# Patient Record
Sex: Male | Born: 1985 | Race: Black or African American | Hispanic: No | Marital: Single | Smoking: Current every day smoker
Health system: Southern US, Community
[De-identification: ages and names within clinical notes are randomized; demographics above are authoritative.]

## PROBLEM LIST (undated history)

## (undated) HISTORY — PX: HAND SURGERY: SHX662

---
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
## 2008-09-12 ENCOUNTER — Emergency Department (HOSPITAL_COMMUNITY): Admission: EM | Admit: 2008-09-12 | Discharge: 2008-09-12 | Payer: Self-pay | Admitting: Family Medicine

## 2009-08-10 ENCOUNTER — Emergency Department (HOSPITAL_COMMUNITY): Admission: EM | Admit: 2009-08-10 | Discharge: 2009-08-11 | Payer: Self-pay | Admitting: Emergency Medicine

## 2010-09-21 ENCOUNTER — Emergency Department (HOSPITAL_COMMUNITY): Admission: EM | Admit: 2010-09-21 | Discharge: 2010-09-21 | Payer: Self-pay | Admitting: Emergency Medicine

## 2010-09-28 ENCOUNTER — Ambulatory Visit (HOSPITAL_COMMUNITY): Admission: RE | Admit: 2010-09-28 | Discharge: 2010-09-28 | Payer: Self-pay | Admitting: Orthopedic Surgery

## 2011-01-12 LAB — COMPREHENSIVE METABOLIC PANEL
ALT: 20 U/L (ref 0–53)
AST: 28 U/L (ref 0–37)
Albumin: 4.3 g/dL (ref 3.5–5.2)
Alkaline Phosphatase: 58 U/L (ref 39–117)
BUN: 9 mg/dL (ref 6–23)
CO2: 23 mEq/L (ref 19–32)
Calcium: 9.8 mg/dL (ref 8.4–10.5)
Chloride: 104 mEq/L (ref 96–112)
Creatinine, Ser: 1.04 mg/dL (ref 0.4–1.5)
GFR calc Af Amer: >60 mL/min (ref >60)
GFR calc non Af Amer: >60 mL/min (ref >60)
Glucose, Bld: 86 mg/dL (ref 70–99)
Potassium: 4.1 mEq/L (ref 3.5–5.1)
Sodium: 137 mEq/L (ref 135–145)
Total Bilirubin: 0.9 mg/dL (ref 0.3–1.2)
Total Protein: 7 g/dL (ref 6.0–8.3)

## 2011-01-12 LAB — CBC
HCT: 44.1 % (ref 39.0–52.0)
Hemoglobin: 15.2 g/dL (ref 13.0–17.0)
MCH: 30.4 pg (ref 26.0–34.0)
MCHC: 34.5 g/dL (ref 30.0–36.0)
MCV: 88.2 fL (ref 78.0–100.0)
Platelets: 219 10*3/uL (ref 150–400)
RBC: 5 MIL/uL (ref 4.22–5.81)
RDW: 13.1 % (ref 11.5–15.5)
WBC: 4.2 10*3/uL (ref 4.0–10.5)

## 2011-01-12 LAB — SURGICAL PCR SCREEN
MRSA, PCR: NEGATIVE
Staphylococcus aureus: POSITIVE — AB

## 2011-02-04 LAB — POCT I-STAT, CHEM 8
Calcium, Ion: 1.19 mmol/L (ref 1.12–1.32)
Chloride: 104 mEq/L (ref 96–112)
HCT: 44 % (ref 39.0–52.0)
Potassium: 3.4 mEq/L — ABNORMAL LOW (ref 3.5–5.1)
Sodium: 139 mEq/L (ref 135–145)

## 2011-08-03 LAB — POCT RAPID STREP A: Streptococcus, Group A Screen (Direct): NEGATIVE

## 2013-04-30 ENCOUNTER — Encounter (HOSPITAL_COMMUNITY): Payer: Self-pay | Admitting: Nurse Practitioner

## 2013-04-30 ENCOUNTER — Emergency Department (HOSPITAL_COMMUNITY)
Admission: EM | Admit: 2013-04-30 | Discharge: 2013-05-01 | Disposition: A | Discharge status: Home / Self Care | Payer: Self-pay | Attending: Emergency Medicine | Admitting: Emergency Medicine

## 2013-04-30 ENCOUNTER — Emergency Department (HOSPITAL_COMMUNITY): Payer: Self-pay

## 2013-04-30 DIAGNOSIS — F17200 Nicotine dependence, unspecified, uncomplicated: Secondary | ICD-10-CM | POA: Insufficient documentation

## 2013-04-30 DIAGNOSIS — J029 Acute pharyngitis, unspecified: Secondary | ICD-10-CM | POA: Insufficient documentation

## 2013-04-30 DIAGNOSIS — Y929 Unspecified place or not applicable: Secondary | ICD-10-CM | POA: Insufficient documentation

## 2013-04-30 DIAGNOSIS — M25549 Pain in joints of unspecified hand: Secondary | ICD-10-CM | POA: Insufficient documentation

## 2013-04-30 DIAGNOSIS — W230XXA Caught, crushed, jammed, or pinched between moving objects, initial encounter: Secondary | ICD-10-CM | POA: Insufficient documentation

## 2013-04-30 DIAGNOSIS — R599 Enlarged lymph nodes, unspecified: Secondary | ICD-10-CM | POA: Insufficient documentation

## 2013-04-30 DIAGNOSIS — B9689 Other specified bacterial agents as the cause of diseases classified elsewhere: Secondary | ICD-10-CM

## 2013-04-30 DIAGNOSIS — R1310 Dysphagia, unspecified: Secondary | ICD-10-CM | POA: Insufficient documentation

## 2013-04-30 DIAGNOSIS — Y998 Other external cause status: Secondary | ICD-10-CM | POA: Insufficient documentation

## 2013-04-30 LAB — BASIC METABOLIC PANEL
BUN: 9 mg/dL (ref 6–23)
Chloride: 100 mEq/L (ref 96–112)
GFR calc Af Amer: >90 mL/min (ref >90)
Potassium: 3.9 mEq/L (ref 3.5–5.1)

## 2013-04-30 LAB — CBC WITH DIFFERENTIAL/PLATELET
Basophils Absolute: 0 10*3/uL (ref 0.0–0.1)
Basophils Relative: 0 % (ref 0–1)
Hemoglobin: 15.3 g/dL (ref 13.0–17.0)
MCHC: 34.9 g/dL (ref 30.0–36.0)
Monocytes Relative: 9 % (ref 3–12)
Neutro Abs: 9.5 10*3/uL — ABNORMAL HIGH (ref 1.7–7.7)
Neutrophils Relative %: 75 % (ref 43–77)
WBC: 12.8 10*3/uL — ABNORMAL HIGH (ref 4.0–10.5)

## 2013-04-30 MED ORDER — ONDANSETRON 4 MG PO TBDP
8.0000 mg | ORAL_TABLET | Freq: Once | ORAL | Status: AC
  Administered 2013-05-01: 8 mg via ORAL
  Filled 2013-04-30: qty 2

## 2013-04-30 MED ORDER — DEXAMETHASONE SODIUM PHOSPHATE 10 MG/ML IJ SOLN
10.0000 mg | Freq: Once | INTRAMUSCULAR | Status: AC
  Administered 2013-05-01: 10 mg via INTRAVENOUS
  Filled 2013-04-30: qty 1

## 2013-04-30 MED ORDER — MORPHINE SULFATE 4 MG/ML IJ SOLN: 4.0000 mg | Freq: Once | INTRAMUSCULAR | Status: DC

## 2013-04-30 NOTE — ED Provider Notes (Signed)
This chart was scribed for  Junious Silk PA-C,  a non-physician practitioner working with Juliet Rude. Rubin Payor, MD by Lewanda Rife, ED Scribe. This patient was seen in room TR07C/TR07C and the patient's care was started at 2304.    History    CSN: [id number] Arrival date & time 04/30/13  1831  First MD Initiated Contact with Patient 04/30/13 2004     Chief Complaint  Patient presents with  . Hand Pain  . Sore Throat   (Consider location/radiation/quality/duration/timing/severity/associated sxs/prior Treatment) The history is provided by the patient.   HPI Comments: Brandon Larson is a 27 y.o. male who presents to the Emergency Department complaining of "possible abscess" in posterior throat onset yesterday. Reports malodorous yellow drainage after "something popped in his throat". Describes sore throat pain as aching and throbbing. Additionally, reports right hand pain after slamming it into a metal door 3 days ago. Pain is aching, worse with palpation, and does not radiate. Denies associated fever, nausea, vomiting, shortness of breath, and difficulty swallowing. Denies taking any pain medications to relieve symptoms PTA.   History reviewed. No pertinent past medical history. History reviewed. No pertinent past surgical history. History reviewed. No pertinent family history. History  Substance Use Topics  . Smoking status: Current Every Day Smoker    Types: Cigarettes  . Smokeless tobacco: Not on file  . Alcohol Use: Yes    Review of Systems  Constitutional: Negative for fever.  HENT: Positive for sore throat.   Musculoskeletal: Positive for myalgias (right hand pain ).  All other systems reviewed and are negative.  A complete 10 system review of systems was obtained and all systems are negative except as noted in the HPI and PMH.    Allergies  Review of patient's allergies indicates no known allergies.  Home Medications  No current outpatient prescriptions on  file. BP 131/66  Pulse 94  Temp(Src) 98.9 F (37.2 C) (Oral)  Wt 200 lb (90.719 kg)  SpO2 96% Physical Exam  Nursing note and vitals reviewed. Constitutional: He is oriented to person, place, and time. He appears well-developed and well-nourished. No distress.  HENT:  Head: Normocephalic and atraumatic. No trismus in the jaw.  Mouth/Throat: Mucous membranes are normal. No edematous. Oropharyngeal exudate and posterior oropharyngeal erythema present. No posterior oropharyngeal edema.  Left tonsillar swelling with exudate  Eyes: EOM are normal.  Neck: Neck supple. No tracheal deviation present.  Cardiovascular: Normal rate.   Pulmonary/Chest: Effort normal. No respiratory distress.  Musculoskeletal: Normal range of motion.       Right hand: He exhibits tenderness (dorsal aspect ).  Neurological: He is alert and oriented to person, place, and time.  Skin: Skin is warm and dry.  Psychiatric: He has a normal mood and affect. His behavior is normal.    ED Course  Procedures (including critical care time) Labs Reviewed  CBC WITH DIFFERENTIAL - Abnormal; Notable for the following:    WBC 12.8 (*)    Neutro Abs 9.5 (*)    Monocytes Absolute 1.2 (*)    All other components within normal limits  BASIC METABOLIC PANEL - Abnormal; Notable for the following:    Glucose, Bld 101 (*)    All other components within normal limits   Ct Soft Tissue Neck W Contrast  05/01/2013   *RADIOLOGY REPORT*  Clinical Data: Throat pain and swelling.  Drainage.  CT NECK WITH CONTRAST  Technique:  Multidetector CT imaging of the neck was performed with intravenous contrast.  Contrast:  80mL OMNIPAQUE IOHEXOL 300 MG/ML  SOLN  Comparison: None.  Findings: Bilateral tonsillar hypertrophy is present.  There is no abscess.  The retropharyngeal soft tissues are within normal limits.  No retropharyngeal effusion.  Prevertebral soft tissues are normal.  Cervical spinal alignment is anatomic.  The diffuse enlargement of  the tonsillar tissue and wall wires ring.  Adenoidal hypertrophy is present as well.  Transverse narrowing of the airway is present associated with tonsillar hypertrophy.  Mildly enlarged bilateral level II lymph nodes are present.  Visualized lung apices appear within normal limits.  Vasculature of the neck is within normal limits.  IMPRESSION: The tonsillar hypertrophy.  No abscess or retropharyngeal effusion. Mildly enlarged bilateral likely reactive level II lymph nodes.   Original Report Authenticated By: Andreas Newport, M.D.   Dg Hand Complete Right  04/30/2013   *RADIOLOGY REPORT*  Clinical Data: Right hand injury, pain.  History of prior surgery.  RIGHT HAND - COMPLETE 3+ VIEW  Comparison: Plain films 09/21/2010.  Findings: K-wires fix healed fractures of the third and fourth metacarpals.  No acute bony or joint abnormality is identified.  No notable degenerative change or focal bony lesion.  IMPRESSION: No acute finding.  Healed third and fourth metacarpal fractures with hardware in place noted.   Original Report Authenticated By: Holley Dexter, M.D.   1. Bacterial pharyngitis     MDM  Pt with tonsillar exudate, cervical lymphadenopathy, & dysphagia; diagnosis of strep. Treated in the Ed with steroids, Pain medication and PCN IM.  Pt appears mildly dehydrated, discussed importance of water rehydration. CT shows no PTA or infxn spread to soft tissue. Specific return precautions discussed. Pt able to drink water in ED without difficulty with intact air way. Recommended PCP follow up. XR done on hand. Hardware intact, neurovasularly intact. Steroids and pain meds for throat will help with hand. Return instructions discussed. Vital signs stable for discharge. Patient / Family / Caregiver informed of clinical course, understand medical decision-making process, and agree with plan.    I personally performed the services described in this documentation, which was scribed in my presence. The  recorded information has been reviewed and is accurate.     Mora Bellman, PA-C 05/01/13 1710

## 2013-04-30 NOTE — ED Notes (Addendum)
Pt with painful swollen red area to back of throat, states 'it has been draining yellow pus." also slammed R hand in metal door on Saturday, pain and swelling since, cms intact. Has old fractures and pins in R hand

## 2013-04-30 NOTE — ED Notes (Signed)
Gave pt ginger ale and crackers per request because he hasn't eaten all day.

## 2013-05-01 ENCOUNTER — Emergency Department (HOSPITAL_COMMUNITY): Payer: Self-pay

## 2013-05-01 MED ORDER — IOHEXOL 300 MG/ML  SOLN
80.0000 mL | Freq: Once | INTRAMUSCULAR | Status: AC | PRN
  Administered 2013-05-01: 80 mL via INTRAVENOUS

## 2013-05-01 MED ORDER — PENICILLIN G BENZATHINE 1200000 UNIT/2ML IM SUSP
1.2000 10*6.[IU] | Freq: Once | INTRAMUSCULAR | Status: AC
  Administered 2013-05-01: 1.2 10*6.[IU] via INTRAMUSCULAR
  Filled 2013-05-01: qty 2

## 2013-05-01 MED ORDER — MORPHINE SULFATE 4 MG/ML IJ SOLN
4.0000 mg | Freq: Once | INTRAMUSCULAR | Status: AC
  Administered 2013-05-01: 4 mg via INTRAVENOUS
  Filled 2013-05-01: qty 1

## 2013-05-01 MED ORDER — OXYCODONE-ACETAMINOPHEN 5-325 MG/5ML PO SOLN: 5.0000 mL | ORAL | Status: DC | PRN

## 2013-05-01 MED ORDER — PROMETHAZINE HCL 25 MG PO TABS: 25.0000 mg | ORAL_TABLET | Freq: Four times a day (QID) | ORAL | Status: DC | PRN

## 2013-05-03 NOTE — ED Provider Notes (Signed)
Medical screening examination/treatment/procedure(s) were performed by non-physician practitioner and as supervising physician I was immediately available for consultation/collaboration.  Delyla Sandeen R. Olivya Sobol, MD 05/03/13 1057 

## 2014-08-13 ENCOUNTER — Encounter (HOSPITAL_COMMUNITY): Payer: Self-pay | Admitting: Emergency Medicine

## 2014-08-13 ENCOUNTER — Emergency Department (HOSPITAL_COMMUNITY): Payer: Self-pay

## 2014-08-13 ENCOUNTER — Emergency Department (HOSPITAL_COMMUNITY)
Admission: EM | Admit: 2014-08-13 | Discharge: 2014-08-13 | Disposition: A | Discharge status: Home / Self Care | Payer: Self-pay | Attending: Emergency Medicine | Admitting: Emergency Medicine

## 2014-08-13 DIAGNOSIS — W010XXA Fall on same level from slipping, tripping and stumbling without subsequent striking against object, initial encounter: Secondary | ICD-10-CM | POA: Insufficient documentation

## 2014-08-13 DIAGNOSIS — Y929 Unspecified place or not applicable: Secondary | ICD-10-CM | POA: Insufficient documentation

## 2014-08-13 DIAGNOSIS — S99911A Unspecified injury of right ankle, initial encounter: Secondary | ICD-10-CM | POA: Insufficient documentation

## 2014-08-13 DIAGNOSIS — Z720 Tobacco use: Secondary | ICD-10-CM | POA: Insufficient documentation

## 2014-08-13 DIAGNOSIS — Y9302 Activity, running: Secondary | ICD-10-CM | POA: Insufficient documentation

## 2014-08-13 DIAGNOSIS — S99921A Unspecified injury of right foot, initial encounter: Secondary | ICD-10-CM | POA: Insufficient documentation

## 2014-08-13 DIAGNOSIS — M79671 Pain in right foot: Secondary | ICD-10-CM

## 2014-08-13 DIAGNOSIS — M25571 Pain in right ankle and joints of right foot: Secondary | ICD-10-CM

## 2014-08-13 DIAGNOSIS — Z9889 Other specified postprocedural states: Secondary | ICD-10-CM | POA: Insufficient documentation

## 2014-08-13 MED ORDER — IBUPROFEN 800 MG PO TABS: 800.0000 mg | ORAL_TABLET | Freq: Three times a day (TID) | ORAL | Status: DC

## 2014-08-13 MED ORDER — IBUPROFEN 400 MG PO TABS
800.0000 mg | ORAL_TABLET | Freq: Once | ORAL | Status: AC
  Administered 2014-08-13: 800 mg via ORAL
  Filled 2014-08-13: qty 2

## 2014-08-13 MED ORDER — HYDROCODONE-ACETAMINOPHEN 5-325 MG PO TABS: 1.0000 | ORAL_TABLET | ORAL | Status: DC | PRN

## 2014-08-13 MED ORDER — HYDROCODONE-ACETAMINOPHEN 5-325 MG PO TABS
1.0000 | ORAL_TABLET | Freq: Once | ORAL | Status: AC
  Administered 2014-08-13: 1 via ORAL
  Filled 2014-08-13: qty 1

## 2014-08-13 NOTE — ED Notes (Signed)
Pt was running last night and noticed a laceration to right 4th toe. Also reports ankle pain.

## 2014-08-13 NOTE — ED Provider Notes (Signed)
CSN: [id number]     Arrival date & time 08/13/14  0907 History   First MD Initiated Contact with Patient 08/13/14 [phone]     Chief Complaint  Patient presents with  . Laceration     (Consider location/radiation/quality/duration/timing/severity/associated sxs/prior Treatment) HPI Comments: Patient running last night injuring right ankle and foot after falling. Specific mechanism of injury unknown. He complains of painful medial ankle, distal foot and laceration of 5th toe.   Patient is a 28 y.o. male presenting with skin laceration. The history is provided by the patient. No language interpreter was used.  Laceration Location:  Foot Foot laceration location:  R ankle and R foot   History reviewed. No pertinent past medical history. Past Surgical History  Procedure Laterality Date  . Hand surgery Right    No family history on file. History  Substance Use Topics  . Smoking status: Current Every Day Smoker    Types: Cigarettes  . Smokeless tobacco: Not on file  . Alcohol Use: Yes    Review of Systems  Constitutional: Negative for fever and chills.  Cardiovascular: Negative.  Negative for chest pain.  Gastrointestinal: Negative.  Negative for abdominal pain.  Musculoskeletal: Negative for back pain and neck pain.       See HPI  Skin: Positive for wound.  Neurological: Negative.  Negative for numbness.      Allergies  Review of patient's allergies indicates no known allergies.  Home Medications   Prior to Admission medications   Medication Sig Start Date End Date Taking? Authorizing Provider  ibuprofen (ADVIL,MOTRIN) 200 MG tablet Take 200 mg by mouth every 6 (six) hours as needed for mild pain.   Yes Historical Provider, MD  oxyCODONE-acetaminophen (ROXICET) 5-325 MG/5ML solution Take 5 mLs by mouth every 4 (four) hours as needed for pain. 05/01/13  Yes Mora BellmanHannah S Merrell, PA-C  promethazine (PHENERGAN) 25 MG tablet Take 1 tablet (25 mg total) by mouth every 6 (six) hours  as needed for nausea. 05/01/13  Yes Mora BellmanHannah S Merrell, PA-C   There were no vitals taken for this visit. Physical Exam  Constitutional: He is oriented to person, place, and time. He appears well-developed and well-nourished.  Neck: Normal range of motion.  Pulmonary/Chest: Effort normal.  Musculoskeletal: Normal range of motion. He exhibits no edema.  No swelling or discoloration noted of right ankle or foot. He has calloused plantar toes, including a linear callous to 5th toe without any visualized laceration or acute dermal change. Tender medial ankle and dorsal foot without joint instability.  Neurological: He is alert and oriented to person, place, and time.  Skin: Skin is warm and dry.  Psychiatric: He has a normal mood and affect.    ED Course  Procedures (including critical care time) Labs Review Labs Reviewed - No data to display  Imaging Review No results found.   EKG Interpretation None      MDM   Final diagnoses:  None    1. Right ankle pain 2. Right foot pain  No laceration noted. Minimal sprain injury suspected given patient's complaint but no objective findings on exam. Negative imaging. Treat supportively.    Arnoldo HookerShari A Rainn Zupko, PA-C 08/13/14 1050

## 2014-08-13 NOTE — ED Notes (Signed)
Patient has small open area underneath 4th toe on R foot.  Bleeding controlled.

## 2014-08-13 NOTE — Discharge Instructions (Signed)
Ankle Pain °Ankle pain is a common symptom. The bones, cartilage, tendons, and muscles of the ankle joint perform a lot of work each day. The ankle joint holds your body weight and allows you to move around. Ankle pain can occur on either side or back of 1 or both ankles. Ankle pain may be sharp and burning or dull and aching. There may be tenderness, stiffness, redness, or warmth around the ankle. The pain occurs more often when a person walks or puts pressure on the ankle. °CAUSES  °There are many reasons ankle pain can develop. It is important to work with your caregiver to identify the cause since many conditions can impact the bones, cartilage, muscles, and tendons. Causes for ankle pain include: °· Injury, including a break (fracture), sprain, or strain often due to a fall, sports, or a high-impact activity. °· Swelling (inflammation) of a tendon (tendonitis). °· Achilles tendon rupture. °· Ankle instability after repeated sprains and strains. °· Poor foot alignment. °· Pressure on a nerve (tarsal tunnel syndrome). °· Arthritis in the ankle or the lining of the ankle. °· Crystal formation in the ankle (gout or pseudogout). °DIAGNOSIS  °A diagnosis is based on your medical history, your symptoms, results of your physical exam, and results of diagnostic tests. Diagnostic tests may include X-ray exams or a computerized magnetic scan (magnetic resonance imaging, MRI). °TREATMENT  °Treatment will depend on the cause of your ankle pain and may include: °· Keeping pressure off the ankle and limiting activities. °· Using crutches or other walking support (a cane or brace). °· Using rest, ice, compression, and elevation. °· Participating in physical therapy or home exercises. °· Wearing shoe inserts or special shoes. °· Losing weight. °· Taking medications to reduce pain or swelling or receiving an injection. °· Undergoing surgery. °HOME CARE INSTRUCTIONS  °· Only take over-the-counter or prescription medicines for  pain, discomfort, or fever as directed by your caregiver. °· Put ice on the injured area. °· Put ice in a plastic bag. °· Place a towel between your skin and the bag. °· Leave the ice on for 15-20 minutes at a time, 03-04 times a day. °· Keep your leg raised (elevated) when possible to lessen swelling. °· Avoid activities that cause ankle pain. °· Follow specific exercises as directed by your caregiver. °· Record how often you have ankle pain, the location of the pain, and what it feels like. This information may be helpful to you and your caregiver. °· Ask your caregiver about returning to work or sports and whether you should drive. °· Follow up with your caregiver for further examination, therapy, or testing as directed. °SEEK MEDICAL CARE IF:  °· Pain or swelling continues or worsens beyond 1 week. °· You have an oral temperature above 102° F (38.9° C). °· You are feeling unwell or have chills. °· You are having an increasingly difficult time with walking. °· You have loss of sensation or other new symptoms. °· You have questions or concerns. °MAKE SURE YOU:  °· Understand these instructions. °· Will watch your condition. °· Will get help right away if you are not doing well or get worse. °Document Released: 04/07/2010 Document Revised: 01/10/2012 Document Reviewed: 04/07/2010 °[employer]® Patient Information ©2015 [employer]. This information is not intended to replace advice given to you by your health care provider. Make sure you discuss any questions you have with your health care provider. °Cryotherapy °Cryotherapy means treatment with cold. Ice or gel packs can be used to   reduce both pain and swelling. Ice is the most helpful within the first 24 to 48 hours after an injury or flare-up from overusing a muscle or joint. Sprains, strains, spasms, burning pain, shooting pain, and aches can all be eased with ice. Ice can also be used when recovering from surgery. Ice is effective, has very few side effects,  and is safe for most people to use. °PRECAUTIONS  °Ice is not a safe treatment option for people with: °· Raynaud phenomenon. This is a condition affecting small blood vessels in the extremities. Exposure to cold may cause your problems to return. °· Cold hypersensitivity. There are many forms of cold hypersensitivity, including: °¨ Cold urticaria. Red, itchy hives appear on the skin when the tissues begin to warm after being iced. °¨ Cold erythema. This is a red, itchy rash caused by exposure to cold. °¨ Cold hemoglobinuria. Red blood cells break down when the tissues begin to warm after being iced. The hemoglobin that carry oxygen are passed into the urine because they cannot combine with blood proteins fast enough. °· Numbness or altered sensitivity in the area being iced. °If you have any of the following conditions, do not use ice until you have discussed cryotherapy with your caregiver: °· Heart conditions, such as arrhythmia, angina, or chronic heart disease. °· High blood pressure. °· Healing wounds or open skin in the area being iced. °· Current infections. °· Rheumatoid arthritis. °· Poor circulation. °· Diabetes. °Ice slows the blood flow in the region it is applied. This is beneficial when trying to stop inflamed tissues from spreading irritating chemicals to surrounding tissues. However, if you expose your skin to cold temperatures for too long or without the proper protection, you can damage your skin or nerves. Watch for signs of skin damage due to cold. °HOME CARE INSTRUCTIONS °Follow these tips to use ice and cold packs safely. °· Place a dry or damp towel between the ice and skin. A damp towel will cool the skin more quickly, so you may need to shorten the time that the ice is used. °· For a more rapid response, add gentle compression to the ice. °· Ice for no more than 10 to 20 minutes at a time. The bonier the area you are icing, the less time it will take to get the benefits of ice. °· Check  your skin after 5 minutes to make sure there are no signs of a poor response to cold or skin damage. °· Rest 20 minutes or more between uses. °· Once your skin is numb, you can end your treatment. You can test numbness by very lightly touching your skin. The touch should be so light that you do not see the skin dimple from the pressure of your fingertip. When using ice, most people will feel these normal sensations in this order: cold, burning, aching, and numbness. °· Do not use ice on someone who cannot communicate their responses to pain, such as small children or people with dementia. °HOW TO MAKE AN ICE PACK °Ice packs are the most common way to use ice therapy. Other methods include ice massage, ice baths, and cryosprays. Muscle creams that cause a cold, tingly feeling do not offer the same benefits that ice offers and should not be used as a substitute unless recommended by your caregiver. °To make an ice pack, do one of the following: °· Place crushed ice or a bag of frozen vegetables in a sealable plastic bag. Squeeze out the excess   air. Place this bag inside another plastic bag. Slide the bag into a pillowcase or place a damp towel between your skin and the bag. °· Mix 3 parts water with 1 part rubbing alcohol. Freeze the mixture in a sealable plastic bag. When you remove the mixture from the freezer, it will be slushy. Squeeze out the excess air. Place this bag inside another plastic bag. Slide the bag into a pillowcase or place a damp towel between your skin and the bag. °SEEK MEDICAL CARE IF: °· You develop white spots on your skin. This may give the skin a blotchy (mottled) appearance. °· Your skin turns blue or pale. °· Your skin becomes waxy or hard. °· Your swelling gets worse. °MAKE SURE YOU:  °· Understand these instructions. °· Will watch your condition. °· Will get help right away if you are not doing well or get worse. °Document Released: 06/14/2011 Document Revised: 03/04/2014 Document  Reviewed: 06/14/2011 °ExitCare® Patient Information ©2015 [employer]. This information is not intended to replace advice given to you by your health care provider. Make sure you discuss any questions you have with your health care provider. ° °

## 2014-08-14 NOTE — ED Provider Notes (Signed)
Medical screening examination/treatment/procedure(s) were performed by non-physician practitioner and as supervising physician I was immediately available for consultation/collaboration.   EKG Interpretation None        Lyanne CoKevin M Ellah Otte, MD 08/14/14 1945

## 2015-08-14 ENCOUNTER — Emergency Department (HOSPITAL_COMMUNITY)
Admission: EM | Admit: 2015-08-14 | Discharge: 2015-08-14 | Payer: Self-pay | Attending: Emergency Medicine | Admitting: Emergency Medicine

## 2015-08-14 ENCOUNTER — Emergency Department (HOSPITAL_COMMUNITY): Payer: Self-pay

## 2015-08-14 ENCOUNTER — Encounter (HOSPITAL_COMMUNITY): Payer: Self-pay | Admitting: Emergency Medicine

## 2015-08-14 DIAGNOSIS — Y9389 Activity, other specified: Secondary | ICD-10-CM | POA: Insufficient documentation

## 2015-08-14 DIAGNOSIS — S82891A Other fracture of right lower leg, initial encounter for closed fracture: Secondary | ICD-10-CM | POA: Insufficient documentation

## 2015-08-14 DIAGNOSIS — Y9289 Other specified places as the place of occurrence of the external cause: Secondary | ICD-10-CM | POA: Insufficient documentation

## 2015-08-14 DIAGNOSIS — W108XXA Fall (on) (from) other stairs and steps, initial encounter: Secondary | ICD-10-CM | POA: Insufficient documentation

## 2015-08-14 DIAGNOSIS — Z720 Tobacco use: Secondary | ICD-10-CM | POA: Insufficient documentation

## 2015-08-14 DIAGNOSIS — Y998 Other external cause status: Secondary | ICD-10-CM | POA: Insufficient documentation

## 2015-08-14 MED ORDER — HYDROCODONE-ACETAMINOPHEN 5-325 MG PO TABS: 1.0000 | ORAL_TABLET | Freq: Four times a day (QID) | ORAL | Status: DC | PRN

## 2015-08-14 MED ORDER — ACETAMINOPHEN 325 MG PO TABS
650.0000 mg | ORAL_TABLET | Freq: Once | ORAL | Status: AC
  Administered 2015-08-14: 650 mg via ORAL
  Filled 2015-08-14: qty 2

## 2015-08-14 NOTE — ED Provider Notes (Signed)
CSN: [id number]     Arrival date & time 08/14/15  1125 History   First MD Initiated Contact with Patient 08/14/15 1128     Chief Complaint  Patient presents with  . right ankle injury      (Consider location/radiation/quality/duration/timing/severity/associated sxs/prior Treatment) HPI   Brandon Larson is a 29 y.o. male with no significant PMH who presents with right ankle fracture.  Patient states that he was working out on Monday when his flip flop got caught on a step causing him to fall and landing on his ankle.  Ibuprofen provides some pain relief. Walking, movement, and palpation make it worse.  Denies numbness/tingling or loss of motor function.  Patient is ambulatory.  No fevers, chills, N/V/D, CP, SOB, abdominal pain.  [facility]ray of right ankle showed spiral [facility]   History reviewed. No pertinent past medical history. Past Surgical History  Procedure Laterality Date  . Hand surgery Right    No family history on file. Social History  Substance Use Topics  . Smoking status: Current Every Day Smoker    Types: Cigarettes  . Smokeless tobacco: None  . Alcohol Use: Yes    Review of Systems  All other systems negative unless otherwise stated in HPI   Allergies  Review of patient's allergies indicates no known allergies.  Home Medications   Prior to Admission medications   Medication Sig Start Date End Date Taking? Authorizing Provider  ibuprofen (ADVIL,MOTRIN) 200 MG tablet Take 200-400 mg by mouth every 6 (six) hours as needed for mild pain or moderate pain.    Yes Historical Provider, MD  HYDROcodone-acetaminophen (NORCO/VICODIN) 5-325 MG tablet Take 1 tablet by mouth every 6 (six) hours as needed. 08/14/15   Damontae Loppnow, PA-C   BP 107/63 mmHg  Pulse 64  Temp(Src) 98.5 F (36.9 C) (Oral)  Resp 16  SpO2 99% Physical Exam  Constitutional: He is oriented to person, place, and time. He appears well-developed and well-nourished.   HENT:  Head: Atraumatic.  Eyes: Conjunctivae are normal. No scleral icterus.  Neck: No tracheal deviation present.  Cardiovascular: Intact distal pulses.   Pulmonary/Chest: Effort normal. No respiratory distress.  Musculoskeletal:  FROM of all 5 digits in right lower extremity.  Neurological: He is alert and oriented to person, place, and time.  Sensation intact in all 5 digits of the right lower extremity.  Decreased ankle strength secondary patient's poor effort due to pain.   Skin: Skin is warm and dry.  Moderate right ankle swelling.  No ecchymosis or abrasions on right ankle or foot.   Psychiatric: He has a normal mood and affect. His behavior is normal.    ED Course  Procedures (including critical care time) Labs Review Labs Reviewed - No data to display  Imaging Review Dg Ankle Complete Right  08/14/2015  CLINICAL DATA:  Twisting right ankle injury while working out yesterday. Lateral pain in the right ankle. EXAM: RIGHT ANKLE - COMPLETE 3+ VIEW COMPARISON:  08/13/2014 FINDINGS: Oblique fracture lateral malleolus, relatively nondisplaced, compatible with Weber B mechanism (supination -external rotation). Surrounding soft tissue swelling. Soft tissue swelling below the medial malleolus noted. No medial malleolar fracture or visible avulsion. No widening of the mortise. No posterior malleolar fracture. IMPRESSION: 1. Weber B fracture of the ankle without visible fracture of the medial malleolus or posterior malleolus. This is at least a Weber B stage II fracture. There is soft tissue swelling below the medial malleolus and injury of  the deltoid ligament is not excluded on conventional radiography. Electronically Signed   By: Gaylyn Rong M.D.   On: 08/14/2015 12:33   I have personally reviewed and evaluated these images and lab results as part of my medical decision-making.   EKG Interpretation None      MDM   Final diagnoses:  Closed right ankle fracture, initial  encounter    Patient presents with right ankle fracture.  VSS, NAD, nontoxic appearing.  Ox exam, moderate right ankle swelling.  No ecchymosis or swelling.  No obvious deformity.  Patient is neurovascularly intact.  [facility] from [facility], so will perform xray here.  Patient given tylenol for pain.  Right ankle plain films show Weber B ankle fracture w/out vs of medial malleolus or posterior mallelus. NWB until ortho follow up for stress view for further workup.       Cheri Fowler, PA-C 08/14/15 1331  Alvira Monday, MD 08/14/15 2333

## 2015-08-14 NOTE — ED Notes (Addendum)
Pt under [facility] [employer] , pt co right ankle injury while working out, reports twisted right ankle. Pt able to move ankle and has complete sensation.  Pain 7/10. Pt alert and oriented x 4. Per pt had xray yesterday and was positive for fracture.

## 2015-08-14 NOTE — Discharge Instructions (Signed)
Ankle Fracture A fracture is a break in a bone. The ankle joint is made up of three bones. These include the lower (distal)sections of your lower leg bones, called the tibia and fibula, along with a bone in your foot, called the talus. Depending on how bad the break is and if more than one ankle joint bone is broken, a cast or splint is used to protect and keep your injured bone from moving while it heals. Sometimes, surgery is required to help the fracture heal properly.  There are two general types of fractures:  Stable fracture. This includes a single fracture line through one bone, with no injury to ankle ligaments. A fracture of the talus that does not have any displacement (movement of the bone on either side of the fracture line) is also stable.  Unstable fracture. This includes more than one fracture line through one or more bones in the ankle joint. It also includes fractures that have displacement of the bone on either side of the fracture line. CAUSES  A direct blow to the ankle.   Quickly and severely twisting your ankle.  Trauma, such as a car accident or falling from a significant height. RISK FACTORS You may be at a higher risk of ankle fracture if:  You have certain medical conditions.  You are involved in high-impact sports.  You are involved in a high-impact car accident. SIGNS AND SYMPTOMS   Tender and swollen ankle.  Bruising around the injured ankle.  Pain on movement of the ankle.  Difficulty walking or putting weight on the ankle.  A cold foot below the site of the ankle injury. This can occur if the blood vessels passing through your injured ankle were also damaged.  Numbness in the foot below the site of the ankle injury. DIAGNOSIS  An ankle fracture is usually diagnosed with a physical exam and X-rays. A CT scan may also be required for complex fractures. TREATMENT  Stable fractures are treated with a cast or splint and using crutches to avoid putting  weight on your injured ankle. This is followed by an ankle strengthening program. Some patients require a special type of cast, depending on other medical problems they may have. Unstable fractures require surgery to ensure the bones heal properly. Your health care provider will tell you what type of fracture you have and the best treatment for your condition. HOME CARE INSTRUCTIONS   Review correct crutch use with your health care provider and use your crutches as directed. Safe use of crutches is extremely important. Misuse of crutches can cause you to fall or cause injury to nerves in your hands or armpits.  Do not put weight or pressure on the injured ankle until directed by your health care provider.  To lessen the swelling, keep the injured leg elevated while sitting or lying down.  Apply ice to the injured area:  Put ice in a plastic bag.  Place a towel between your cast and the bag.  Leave the ice on for 20 minutes, 2-3 times a day.  If you have a plaster or fiberglass cast:  Do not try to scratch the skin under the cast with any objects. This can increase your risk of skin infection.  Check the skin around the cast every day. You may put lotion on any red or sore areas.  Keep your cast dry and clean.  If you have a plaster splint:  Wear the splint as directed.  You may loosen the elastic   around the splint if your toes become numb, tingle, or turn cold or blue.  Do not put pressure on any part of your cast or splint; it may break. Rest your cast only on a pillow the first 24 hours until it is fully hardened.  Your cast or splint can be protected during bathing with a plastic bag sealed to your skin with medical tape. Do not lower the cast or splint into water.  Take medicines as directed by your health care provider. Only take over-the-counter or prescription medicines for pain, discomfort, or fever as directed by your health care provider.  Do not drive a vehicle until  your health care provider specifically tells you it is safe to do so.  If your health care provider has given you a follow-up appointment, it is very important to keep that appointment. Not keeping the appointment could result in a chronic or permanent injury, pain, and disability. If you have any problem keeping the appointment, call the facility for assistance. SEEK MEDICAL CARE IF: You develop increased swelling or discomfort. SEEK IMMEDIATE MEDICAL CARE IF:   Your cast gets damaged or breaks.  You have continued severe pain.  You develop new pain or swelling after the cast was put on.  Your skin or toenails below the injury turn blue or gray.  Your skin or toenails below the injury feel cold, numb, or have loss of sensitivity to touch.  There is a bad smell or pus draining from under the cast. MAKE SURE YOU:   Understand these instructions.  Will watch your condition.  Will get help right away if you are not doing well or get worse.   This information is not intended to replace advice given to you by your health care provider. Make sure you discuss any questions you have with your health care provider.   Document Released: 10/15/2000 Document Revised: 10/23/2013 Document Reviewed: 05/17/2013 Elsevier Interactive Patient Education 2016 [employer]  
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
## 2023-03-01 DIAGNOSIS — M62830 Muscle spasm of back: Secondary | ICD-10-CM | POA: Diagnosis not present

## 2023-03-01 DIAGNOSIS — M546 Pain in thoracic spine: Secondary | ICD-10-CM | POA: Diagnosis not present

## 2023-04-22 DIAGNOSIS — R682 Dry mouth, unspecified: Secondary | ICD-10-CM | POA: Diagnosis not present

## 2023-04-22 DIAGNOSIS — R0602 Shortness of breath: Secondary | ICD-10-CM | POA: Diagnosis not present

## 2023-04-22 DIAGNOSIS — Z1330 Encounter for screening examination for mental health and behavioral disorders, unspecified: Secondary | ICD-10-CM | POA: Diagnosis not present

## 2023-04-22 DIAGNOSIS — R7989 Other specified abnormal findings of blood chemistry: Secondary | ICD-10-CM | POA: Diagnosis not present

## 2023-04-22 DIAGNOSIS — Z131 Encounter for screening for diabetes mellitus: Secondary | ICD-10-CM | POA: Diagnosis not present

## 2023-04-22 DIAGNOSIS — L749 Eccrine sweat disorder, unspecified: Secondary | ICD-10-CM | POA: Diagnosis not present

## 2023-04-22 DIAGNOSIS — R5383 Other fatigue: Secondary | ICD-10-CM | POA: Diagnosis not present

## 2023-04-26 DIAGNOSIS — R5383 Other fatigue: Secondary | ICD-10-CM | POA: Diagnosis not present

## 2023-04-29 DIAGNOSIS — R5383 Other fatigue: Secondary | ICD-10-CM | POA: Diagnosis not present

## 2023-04-29 DIAGNOSIS — R0602 Shortness of breath: Secondary | ICD-10-CM | POA: Diagnosis not present

## 2023-04-29 DIAGNOSIS — R682 Dry mouth, unspecified: Secondary | ICD-10-CM | POA: Diagnosis not present

## 2023-04-29 DIAGNOSIS — L749 Eccrine sweat disorder, unspecified: Secondary | ICD-10-CM | POA: Diagnosis not present

## 2024-01-23 ENCOUNTER — Emergency Department (HOSPITAL_COMMUNITY): Admission: EM | Admit: 2024-01-23 | Discharge: 2024-01-23 | Disposition: A | Discharge status: Home / Self Care

## 2024-01-23 DIAGNOSIS — S0502XA Injury of conjunctiva and corneal abrasion without foreign body, left eye, initial encounter: Secondary | ICD-10-CM | POA: Diagnosis not present

## 2024-01-23 DIAGNOSIS — W548XXA Other contact with dog, initial encounter: Secondary | ICD-10-CM | POA: Insufficient documentation

## 2024-01-23 MED ORDER — FLUORESCEIN SODIUM 1 MG OP STRP
1.0000 | ORAL_STRIP | Freq: Once | OPHTHALMIC | Status: DC
  Filled 2024-01-23: qty 1

## 2024-01-23 MED ORDER — FLUORESCEIN SODIUM 1 MG OP STRP: 1.0000 | ORAL_STRIP | Freq: Once | OPHTHALMIC | Status: DC

## 2024-01-23 MED ORDER — TETRACAINE HCL 0.5 % OP SOLN: 2.0000 [drp] | Freq: Once | OPHTHALMIC | Status: DC

## 2024-01-23 MED ORDER — TOBRAMYCIN 0.3 % OP SOLN: 1.0000 [drp] | OPHTHALMIC | 0 refills | Status: AC

## 2024-01-23 MED ORDER — TETRACAINE HCL 0.5 % OP SOLN
1.0000 [drp] | Freq: Once | OPHTHALMIC | Status: AC
  Administered 2024-01-23: 1 [drp] via OPHTHALMIC
  Filled 2024-01-23: qty 4

## 2024-01-23 NOTE — Discharge Instructions (Signed)
 Return if any problems.
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
## 2024-01-25 ENCOUNTER — Emergency Department (HOSPITAL_COMMUNITY)
Admission: EM | Admit: 2024-01-25 | Discharge: 2024-01-25 | Disposition: A | Discharge status: Home / Self Care | Attending: Emergency Medicine | Admitting: Emergency Medicine

## 2024-01-25 ENCOUNTER — Other Ambulatory Visit: Payer: Self-pay

## 2024-01-25 ENCOUNTER — Encounter (HOSPITAL_COMMUNITY): Payer: Self-pay

## 2024-01-25 ENCOUNTER — Emergency Department (HOSPITAL_COMMUNITY)

## 2024-01-25 DIAGNOSIS — S60221A Contusion of right hand, initial encounter: Secondary | ICD-10-CM | POA: Diagnosis not present

## 2024-01-25 DIAGNOSIS — M79641 Pain in right hand: Secondary | ICD-10-CM | POA: Diagnosis not present

## 2024-01-25 DIAGNOSIS — S6991XA Unspecified injury of right wrist, hand and finger(s), initial encounter: Secondary | ICD-10-CM | POA: Diagnosis not present

## 2024-01-25 MED ORDER — KETOROLAC TROMETHAMINE 15 MG/ML IJ SOLN
15.0000 mg | Freq: Once | INTRAMUSCULAR | Status: AC
  Administered 2024-01-25: 15 mg via INTRAMUSCULAR
  Filled 2024-01-25: qty 1
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
## 2024-02-13 ENCOUNTER — Emergency Department (HOSPITAL_COMMUNITY)

## 2024-02-13 ENCOUNTER — Emergency Department (HOSPITAL_COMMUNITY)
Admission: EM | Admit: 2024-02-13 | Discharge: 2024-02-13 | Discharge status: Against Medical Advice | Attending: Emergency Medicine | Admitting: Emergency Medicine

## 2024-02-13 ENCOUNTER — Other Ambulatory Visit: Payer: Self-pay

## 2024-02-13 ENCOUNTER — Encounter (HOSPITAL_COMMUNITY): Payer: Self-pay

## 2024-02-13 DIAGNOSIS — M549 Dorsalgia, unspecified: Secondary | ICD-10-CM | POA: Diagnosis not present

## 2024-02-13 DIAGNOSIS — R0781 Pleurodynia: Secondary | ICD-10-CM | POA: Diagnosis not present

## 2024-02-13 DIAGNOSIS — M4802 Spinal stenosis, cervical region: Secondary | ICD-10-CM | POA: Diagnosis not present

## 2024-02-13 DIAGNOSIS — S199XXA Unspecified injury of neck, initial encounter: Secondary | ICD-10-CM | POA: Diagnosis not present

## 2024-02-13 DIAGNOSIS — R0789 Other chest pain: Secondary | ICD-10-CM | POA: Diagnosis not present

## 2024-02-13 DIAGNOSIS — Z5321 Procedure and treatment not carried out due to patient leaving prior to being seen by health care provider: Secondary | ICD-10-CM | POA: Diagnosis not present

## 2024-02-13 DIAGNOSIS — M545 Low back pain, unspecified: Secondary | ICD-10-CM | POA: Diagnosis not present

## 2024-02-13 DIAGNOSIS — S0990XA Unspecified injury of head, initial encounter: Secondary | ICD-10-CM | POA: Diagnosis not present
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
## 2024-02-13 NOTE — ED Notes (Signed)
 No resp for vitals 20+ mins - pt previously mentioned leaving due to wait time
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
## 2024-05-10 ENCOUNTER — Ambulatory Visit: Admitting: Family Medicine

## 2024-07-04 DIAGNOSIS — M25511 Pain in right shoulder: Secondary | ICD-10-CM | POA: Diagnosis not present

## 2024-07-04 DIAGNOSIS — M545 Low back pain, unspecified: Secondary | ICD-10-CM | POA: Diagnosis not present

## 2024-07-04 DIAGNOSIS — M25562 Pain in left knee: Secondary | ICD-10-CM | POA: Diagnosis not present

## 2024-07-25 ENCOUNTER — Emergency Department (HOSPITAL_COMMUNITY)
Admission: EM | Admit: 2024-07-25 | Discharge: 2024-07-25 | Disposition: A | Discharge status: Home / Self Care | Attending: Emergency Medicine | Admitting: Emergency Medicine

## 2024-07-25 ENCOUNTER — Emergency Department (HOSPITAL_COMMUNITY)

## 2024-07-25 DIAGNOSIS — S2091XA Abrasion of unspecified parts of thorax, initial encounter: Secondary | ICD-10-CM | POA: Diagnosis not present

## 2024-07-25 DIAGNOSIS — S199XXA Unspecified injury of neck, initial encounter: Secondary | ICD-10-CM | POA: Diagnosis not present

## 2024-07-25 DIAGNOSIS — M549 Dorsalgia, unspecified: Secondary | ICD-10-CM | POA: Diagnosis not present

## 2024-07-25 DIAGNOSIS — S30810A Abrasion of lower back and pelvis, initial encounter: Secondary | ICD-10-CM | POA: Diagnosis not present

## 2024-07-25 DIAGNOSIS — Y92410 Unspecified street and highway as the place of occurrence of the external cause: Secondary | ICD-10-CM | POA: Insufficient documentation

## 2024-07-25 DIAGNOSIS — S0003XA Contusion of scalp, initial encounter: Secondary | ICD-10-CM | POA: Diagnosis not present

## 2024-07-25 DIAGNOSIS — S299XXA Unspecified injury of thorax, initial encounter: Secondary | ICD-10-CM | POA: Diagnosis not present

## 2024-07-25 DIAGNOSIS — S79912A Unspecified injury of left hip, initial encounter: Secondary | ICD-10-CM | POA: Diagnosis not present

## 2024-07-25 DIAGNOSIS — Z743 Need for continuous supervision: Secondary | ICD-10-CM | POA: Diagnosis not present

## 2024-07-25 DIAGNOSIS — S3993XA Unspecified injury of pelvis, initial encounter: Secondary | ICD-10-CM | POA: Diagnosis not present

## 2024-07-25 DIAGNOSIS — S30811A Abrasion of abdominal wall, initial encounter: Secondary | ICD-10-CM | POA: Diagnosis not present

## 2024-07-25 DIAGNOSIS — R0789 Other chest pain: Secondary | ICD-10-CM | POA: Diagnosis not present

## 2024-07-25 DIAGNOSIS — S0990XA Unspecified injury of head, initial encounter: Secondary | ICD-10-CM | POA: Diagnosis present

## 2024-07-25 DIAGNOSIS — S20212A Contusion of left front wall of thorax, initial encounter: Secondary | ICD-10-CM | POA: Insufficient documentation

## 2024-07-25 DIAGNOSIS — Y9301 Activity, walking, marching and hiking: Secondary | ICD-10-CM | POA: Diagnosis not present

## 2024-07-25 DIAGNOSIS — S3991XA Unspecified injury of abdomen, initial encounter: Secondary | ICD-10-CM | POA: Insufficient documentation

## 2024-07-25 DIAGNOSIS — S4992XA Unspecified injury of left shoulder and upper arm, initial encounter: Secondary | ICD-10-CM | POA: Diagnosis not present

## 2024-07-25 DIAGNOSIS — M25552 Pain in left hip: Secondary | ICD-10-CM | POA: Diagnosis not present

## 2024-07-25 DIAGNOSIS — S0083XA Contusion of other part of head, initial encounter: Secondary | ICD-10-CM | POA: Diagnosis not present

## 2024-07-25 DIAGNOSIS — S3992XA Unspecified injury of lower back, initial encounter: Secondary | ICD-10-CM | POA: Diagnosis not present

## 2024-07-25 LAB — CBC
HCT: 45.2 % (ref 39.0–52.0)
Hemoglobin: 15.5 g/dL (ref 13.0–17.0)
MCH: 30 pg (ref 26.0–34.0)
MCHC: 34.3 g/dL (ref 30.0–36.0)
MCV: 87.6 fL (ref 80.0–100.0)
Platelets: 260 K/uL (ref 150–400)
RBC: 5.16 MIL/uL (ref 4.22–5.81)
RDW: 13 % (ref 11.5–15.5)
WBC: 7.5 K/uL (ref 4.0–10.5)
nRBC: 0 % (ref 0.0–0.2)

## 2024-07-25 LAB — COMPREHENSIVE METABOLIC PANEL WITH GFR
ALT: 21 U/L (ref 0–44)
AST: 33 U/L (ref 15–41)
Albumin: 3.7 g/dL (ref 3.5–5.0)
Alkaline Phosphatase: 39 U/L (ref 38–126)
Anion gap: 12 (ref 5–15)
BUN: 8 mg/dL (ref 6–20)
CO2: 20 mmol/L — ABNORMAL LOW (ref 22–32)
Calcium: 9.2 mg/dL (ref 8.9–10.3)
Chloride: 105 mmol/L (ref 98–111)
Creatinine, Ser: 0.96 mg/dL (ref 0.61–1.24)
GFR, Estimated: >60 mL/min (ref >60)
Glucose, Bld: 88 mg/dL (ref 70–99)
Potassium: 3.9 mmol/L (ref 3.5–5.1)
Sodium: 137 mmol/L (ref 135–145)
Total Bilirubin: 1.3 mg/dL — ABNORMAL HIGH (ref 0.0–1.2)
Total Protein: 5.8 g/dL — ABNORMAL LOW (ref 6.5–8.1)

## 2024-07-25 LAB — ETHANOL: Alcohol, Ethyl (B): <15 mg/dL (ref <15)

## 2024-07-25 LAB — I-STAT CHEM 8, ED
BUN: 7 mg/dL (ref 6–20)
Calcium, Ion: 1.14 mmol/L — ABNORMAL LOW (ref 1.15–1.40)
Chloride: 104 mmol/L (ref 98–111)
Creatinine, Ser: 0.9 mg/dL (ref 0.61–1.24)
Glucose, Bld: 88 mg/dL (ref 70–99)
HCT: 46 % (ref 39.0–52.0)
Hemoglobin: 15.6 g/dL (ref 13.0–17.0)
Potassium: 4 mmol/L (ref 3.5–5.1)
Sodium: 138 mmol/L (ref 135–145)
TCO2: 22 mmol/L (ref 22–32)

## 2024-07-25 LAB — SAMPLE TO BLOOD BANK

## 2024-07-25 LAB — I-STAT CG4 LACTIC ACID, ED: Lactic Acid, Venous: 3 mmol/L (ref 0.5–1.9)

## 2024-07-25 LAB — PROTIME-INR
INR: 0.9 (ref 0.8–1.2)
Prothrombin Time: 12.9 s (ref 11.4–15.2)

## 2024-07-25 MED ORDER — LORAZEPAM 2 MG/ML IJ SOLN: 0.5000 mg | Freq: Once | INTRAMUSCULAR | Status: DC

## 2024-07-25 MED ORDER — MORPHINE SULFATE (PF) 4 MG/ML IV SOLN
4.0000 mg | Freq: Once | INTRAVENOUS | Status: AC
  Administered 2024-07-25: 4 mg via INTRAVENOUS
  Filled 2024-07-25: qty 1

## 2024-07-25 MED ORDER — IOHEXOL 350 MG/ML SOLN
75.0000 mL | Freq: Once | INTRAVENOUS | Status: AC | PRN
  Administered 2024-07-25: 75 mL via INTRAVENOUS

## 2024-07-25 MED ORDER — OXYCODONE-ACETAMINOPHEN 5-325 MG PO TABS: 1.0000 | ORAL_TABLET | Freq: Four times a day (QID) | ORAL | 0 refills | Status: AC | PRN

## 2024-07-25 MED ORDER — KETOROLAC TROMETHAMINE 15 MG/ML IJ SOLN: 15.0000 mg | Freq: Once | INTRAMUSCULAR | Status: DC

## 2024-07-25 MED ORDER — FENTANYL CITRATE PF 50 MCG/ML IJ SOSY: 50.0000 ug | PREFILLED_SYRINGE | Freq: Once | INTRAMUSCULAR | Status: DC

## 2024-07-25 MED ORDER — TETANUS-DIPHTH-ACELL PERTUSSIS 5-2.5-18.5 LF-MCG/0.5 IM SUSY
0.5000 mL | PREFILLED_SYRINGE | Freq: Once | INTRAMUSCULAR | Status: DC
  Filled 2024-07-25: qty 0.5

## 2024-07-25 MED ORDER — FENTANYL CITRATE PF 50 MCG/ML IJ SOSY
PREFILLED_SYRINGE | INTRAMUSCULAR | Status: AC
  Administered 2024-07-25: 100 ug via INTRAVENOUS
  Filled 2024-07-25: qty 2

## 2024-07-25 MED ORDER — OXYCODONE-ACETAMINOPHEN 5-325 MG PO TABS
2.0000 | ORAL_TABLET | Freq: Once | ORAL | Status: AC
  Administered 2024-07-25: 2 via ORAL
  Filled 2024-07-25: qty 2

## 2024-07-25 MED ORDER — CYCLOBENZAPRINE HCL 10 MG PO TABS: 10.0000 mg | ORAL_TABLET | Freq: Two times a day (BID) | ORAL | 0 refills | Status: AC | PRN

## 2024-07-25 MED ORDER — FENTANYL CITRATE PF 50 MCG/ML IJ SOSY: 100.0000 ug | PREFILLED_SYRINGE | Freq: Once | INTRAMUSCULAR | Status: AC

## 2024-07-25 NOTE — ED Notes (Signed)
 CCMD called to place the patient on cardiac monitoring services.
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
